# Patient Record
Sex: Female | Born: 1995 | Race: White | Hispanic: No | Marital: Married | Smoking: Never smoker
Health system: Southern US, Community
[De-identification: ages and names within clinical notes are randomized; demographics above are authoritative.]

## PROBLEM LIST (undated history)

## (undated) DIAGNOSIS — J45909 Unspecified asthma, uncomplicated: Secondary | ICD-10-CM

## (undated) DIAGNOSIS — F32A Depression, unspecified: Secondary | ICD-10-CM

## (undated) DIAGNOSIS — K589 Irritable bowel syndrome without diarrhea: Secondary | ICD-10-CM

## (undated) DIAGNOSIS — D649 Anemia, unspecified: Secondary | ICD-10-CM

## (undated) DIAGNOSIS — F329 Major depressive disorder, single episode, unspecified: Secondary | ICD-10-CM

## (undated) HISTORY — DX: Unspecified asthma, uncomplicated: J45.909

## (undated) HISTORY — DX: Depression, unspecified: F32.A

## (undated) HISTORY — DX: Major depressive disorder, single episode, unspecified: F32.9

## (undated) HISTORY — DX: Anemia, unspecified: D64.9

## (undated) HISTORY — DX: Irritable bowel syndrome, unspecified: K58.9

---
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
## 2015-01-26 ENCOUNTER — Other Ambulatory Visit: Payer: Self-pay | Admitting: Family Medicine

## 2015-02-10 ENCOUNTER — Ambulatory Visit (INDEPENDENT_AMBULATORY_CARE_PROVIDER_SITE_OTHER): Payer: Medicaid Other | Admitting: Family Medicine

## 2015-02-10 ENCOUNTER — Encounter: Payer: Self-pay | Admitting: Family Medicine

## 2015-02-10 VITALS — BP 91/61 | HR 67 | Temp 97.1°F | Ht 65.0 in | Wt 100.0 lb

## 2015-02-10 DIAGNOSIS — Z00129 Encounter for routine child health examination without abnormal findings: Secondary | ICD-10-CM

## 2015-02-10 DIAGNOSIS — J45909 Unspecified asthma, uncomplicated: Secondary | ICD-10-CM

## 2015-02-10 DIAGNOSIS — Z0000 Encounter for general adult medical examination without abnormal findings: Secondary | ICD-10-CM

## 2015-02-10 NOTE — Progress Notes (Signed)
Subjective:  Patient ID: Erin Shepard, female    DOB: 11/12/1995  Age: 19 y.o. MRN: [id number]  CC: Annual Exam   HPI Isys Thornsberry presents for annual physical. Needs paperwork completed for college. She does not have her shot records but says that her last injection was in seventh grade. She declines all immunizations are residual growth at this time. She states that her asthma is stable as long as she uses her Symbicort.  History Briceidy has a past medical history of Anemia; Asthma; and Depression.   She has no past surgical history on file.   Her family history includes Asthma in her mother.She reports that she has never smoked. She does not have any smokeless tobacco history on file. She reports that she does not drink alcohol or use illicit drugs.  Outpatient Prescriptions Prior to Visit  Medication Sig Dispense Refill  . albuterol (PROVENTIL HFA;VENTOLIN HFA) 108 (90 BASE) MCG/ACT inhaler Inhale 2 puffs into the lungs every 6 (six) hours as needed for wheezing or shortness of breath. 1 Inhaler 11  . budesonide-formoterol (SYMBICORT) 160-4.5 MCG/ACT inhaler Inhale 2 puffs into the lungs 2 (two) times daily. 1 Inhaler 11  . tretinoin microspheres (RETIN-A MICRO) 0.1 % gel Apply topically at bedtime. (Patient not taking: Reported on 11/08/2014) 45 g 5  . amoxicillin (AMOXIL) 500 MG tablet Take 1 tablet (500 mg total) by mouth 2 (two) times daily. 20 tablet 0  . SYMBICORT 160-4.5 MCG/ACT inhaler USE 2 PUFFS TWICE A DAY. NEEDS TO BE SEEN 10.2 Inhaler 1   No facility-administered medications prior to visit.    ROS Review of Systems  Constitutional: Negative for fever, chills, diaphoresis, appetite change, fatigue and unexpected weight change.  HENT: Negative for congestion, ear pain, hearing loss, postnasal drip, rhinorrhea, sneezing, sore throat and trouble swallowing.   Eyes: Negative for pain.  Respiratory: Negative for cough, chest tightness and shortness of breath.     Cardiovascular: Negative for chest pain and palpitations.  Gastrointestinal: Negative for nausea, vomiting, abdominal pain, diarrhea and constipation.  Genitourinary: Negative for dysuria, frequency and menstrual problem.  Musculoskeletal: Negative for joint swelling and arthralgias.  Skin: Negative for rash.  Neurological: Negative for dizziness, weakness, numbness and headaches.  Psychiatric/Behavioral: Negative for dysphoric mood and agitation.    Objective:  BP 91/61 mmHg  Pulse 67  Temp(Src) 97.1 F (36.2 C) (Oral)  Ht 5\' 5"  (1.651 m)  Wt 100 lb (45.36 kg)  BMI 16.64 kg/m2  BP Readings from Last 3 Encounters:  02/10/15 91/61  11/08/14 102/71  09/26/14 81/53    Wt Readings from Last 3 Encounters:  02/10/15 100 lb (45.36 kg) (4 %*, Z = -1.72)  11/08/14 96 lb 9.6 oz (43.817 kg) (2 %*, Z = -2.02)  09/26/14 98 lb 6.4 oz (44.634 kg) (3 %*, Z = -1.82)   * Growth percentiles are based on CDC 2-20 Years data.     Physical Exam  Constitutional: She is oriented to person, place, and time. She appears well-developed and well-nourished. No distress.  HENT:  Head: Normocephalic and atraumatic.  Right Ear: External ear normal.  Left Ear: External ear normal.  Nose: Nose normal.  Mouth/Throat: Oropharynx is clear and moist.  Eyes: Conjunctivae and EOM are normal. Pupils are equal, round, and reactive to light.  Neck: Normal range of motion. Neck supple. No thyromegaly present.  Cardiovascular: Normal rate, regular rhythm and normal heart sounds.   No murmur heard. Pulmonary/Chest: Effort normal and breath sounds normal. No  respiratory distress. She has no wheezes. She has no rales.  Abdominal: Soft. Bowel sounds are normal. She exhibits no distension. There is no tenderness.  Lymphadenopathy:    She has no cervical adenopathy.  Neurological: She is alert and oriented to person, place, and time. She has normal reflexes.  Skin: Skin is warm and dry.  Psychiatric: She has a  normal mood and affect. Her behavior is normal. Judgment and thought content normal.    No results found for: HGBA1C  Lab Results  Component Value Date   WBC 7.7 10/24/2013   HGB 14.0 10/24/2013   HCT 41.7 10/24/2013   GLUCOSE 159* 10/24/2013   ALT 18 10/24/2013   AST 28 10/24/2013   NA 138 10/24/2013   K 3.6 10/24/2013   CL 97 10/24/2013   CREATININE 0.75 10/24/2013   BUN 8 10/24/2013   CO2 20 10/24/2013    US Pelvis Complete  05/24/2013   CLINICAL DATA:  Pain during menstruation.  EXAM: TRANSABDOMINAL ULTRASOUND OF PELVIS  TECHNIQUE: Transabdominal ultrasound examination of the pelvis was performed including evaluation of the uterus, ovaries, adnexal regions, and pelvic cul-de-sac.  COMPARISON:  None.  FINDINGS: Uterus  Measurements: 6.5 x 2.5 x 4.5 cm No fibroids or other mass visualized.  Endometrium  Thickness: 6 mm  No focal abnormality visualized.  Right ovary  Not visualized due to overlying bowel gas.  Left ovary  Not visualized due to overlying bowel gas.  Other findings:  Trace free fluid  IMPRESSION: Normal-appearing uterus. Ovaries not visualized due to overlying bowel gas.   Electronically Signed   By: Jerene Dilling M.D.   On: 05/24/2013 17:18    Assessment & Plan:   Lema was seen today for annual exam.  Diagnoses and all orders for this visit:  Well adult exam  Asthma, chronic, unspecified asthma severity, uncomplicated Orders: -     albuterol (PROVENTIL HFA;VENTOLIN HFA) 108 (90 BASE) MCG/ACT inhaler; Inhale 2 puffs into the lungs every 6 (six) hours as needed for wheezing or shortness of breath. -     budesonide-formoterol (SYMBICORT) 160-4.5 MCG/ACT inhaler; Inhale 2 puffs into the lungs 2 (two) times daily.   I have discontinued Ms. Sortino's amoxicillin and SYMBICORT. I am also having her maintain her albuterol, budesonide-formoterol, and tretinoin microspheres.  No orders of the defined types were placed in this encounter.   Patient was  counseled on appropriate diet. Low carbohydrate, low sodium, high protein approach.   Regular exercise benefit with mix of cardio and resistance training was reviewed.  Reminded to wear seat belt when driving or a passenger.   Follow-up: Return in about 1 year (around 02/10/2016).  Mechele Claude, M.D.

## 2015-02-11 MED ORDER — ALBUTEROL SULFATE HFA 108 (90 BASE) MCG/ACT IN AERS: 2.0000 | INHALATION_SPRAY | Freq: Four times a day (QID) | RESPIRATORY_TRACT | Status: DC | PRN

## 2015-02-11 MED ORDER — BUDESONIDE-FORMOTEROL FUMARATE 160-4.5 MCG/ACT IN AERO: 2.0000 | INHALATION_SPRAY | Freq: Two times a day (BID) | RESPIRATORY_TRACT | Status: DC

## 2015-02-12 ENCOUNTER — Other Ambulatory Visit: Payer: Self-pay | Admitting: *Deleted

## 2015-02-12 DIAGNOSIS — J45909 Unspecified asthma, uncomplicated: Secondary | ICD-10-CM

## 2015-02-12 MED ORDER — BUDESONIDE-FORMOTEROL FUMARATE 160-4.5 MCG/ACT IN AERO: 2.0000 | INHALATION_SPRAY | Freq: Two times a day (BID) | RESPIRATORY_TRACT | Status: DC

## 2015-02-12 MED ORDER — ALBUTEROL SULFATE HFA 108 (90 BASE) MCG/ACT IN AERS: 2.0000 | INHALATION_SPRAY | Freq: Four times a day (QID) | RESPIRATORY_TRACT | Status: DC | PRN

## 2015-03-01 IMAGING — US US PELVIS COMPLETE
1 series · 14 of 23 positions shown · non-contrast
Comparison: None.

CLINICAL DATA: Pain during menstruation.

EXAM:
TRANSABDOMINAL ULTRASOUND OF PELVIS
TECHNIQUE: Transabdominal ultrasound examination of the pelvis was performed
including evaluation of the uterus, ovaries, adnexal regions, and
pelvic cul-de-sac.

[Series 1: us pelvis complete · 0.22mm/px · 14 of 23 slices shown]
[im 1/23]
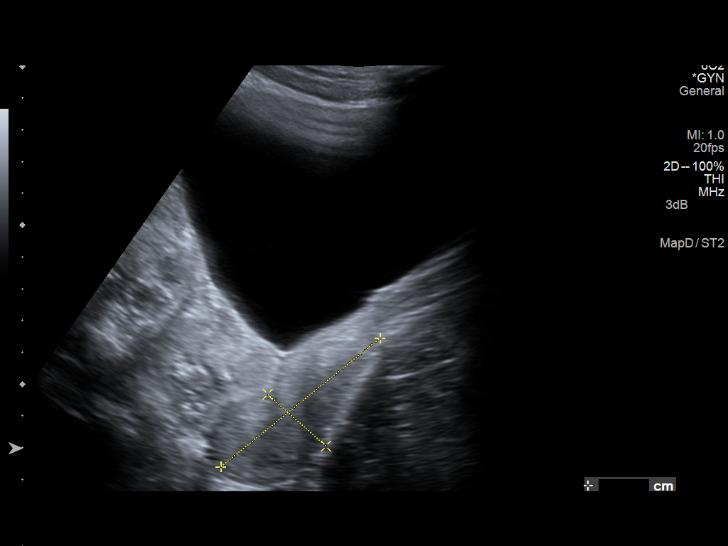
[im 3/23]
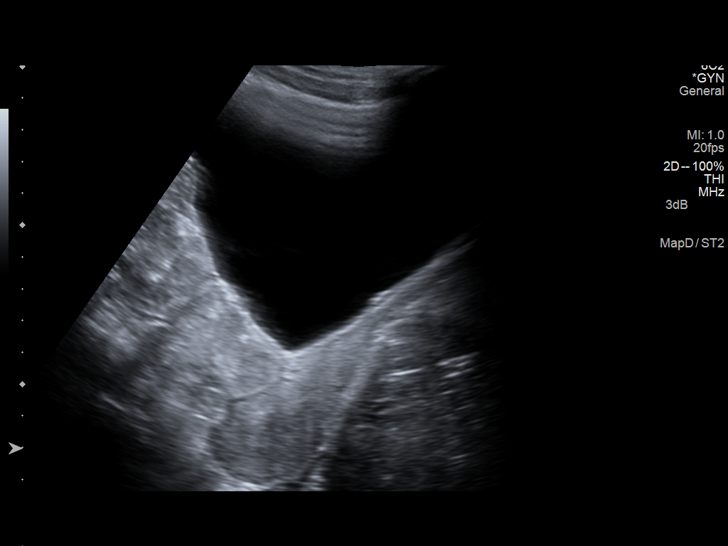
[im 5/23]
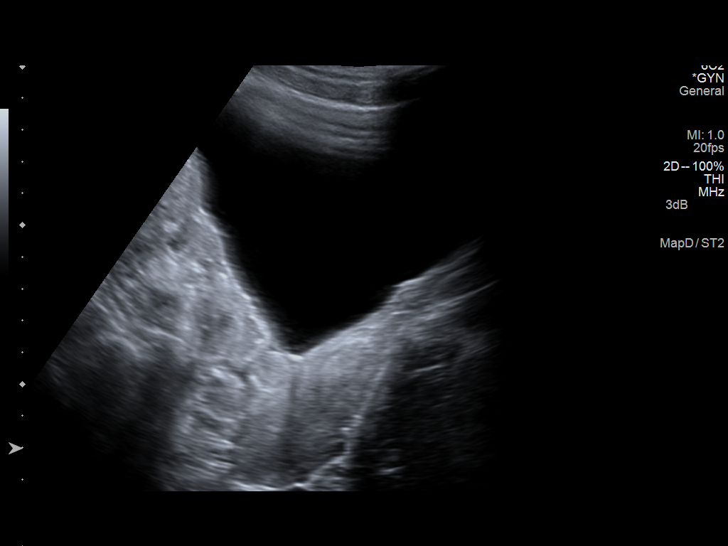
[im 6/23]
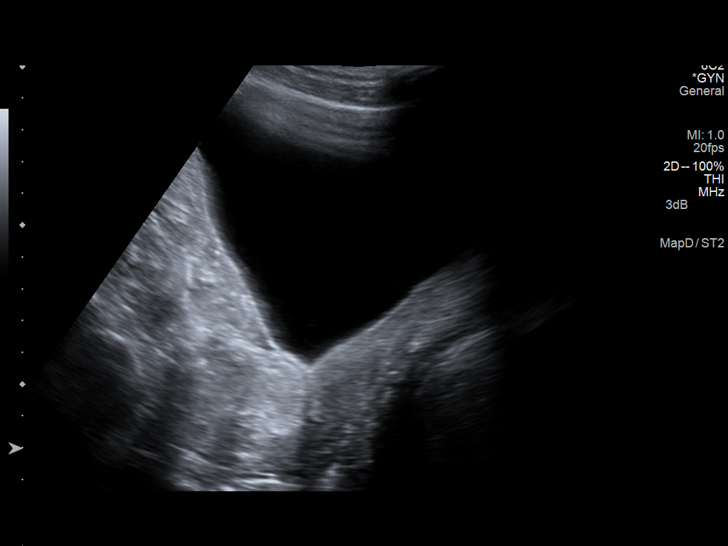
[im 8/23]
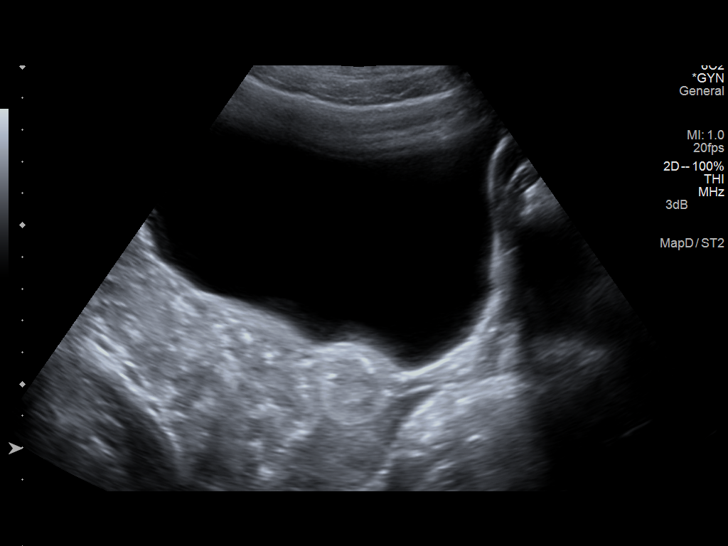
[im 10/23]
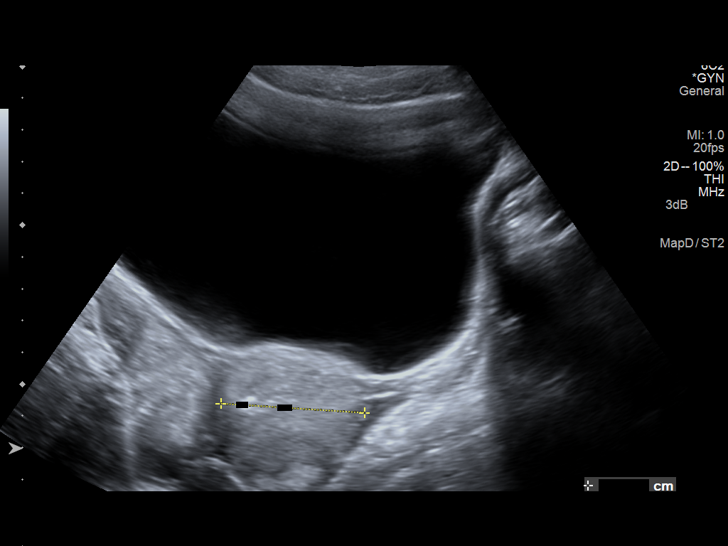
[im 11/23]
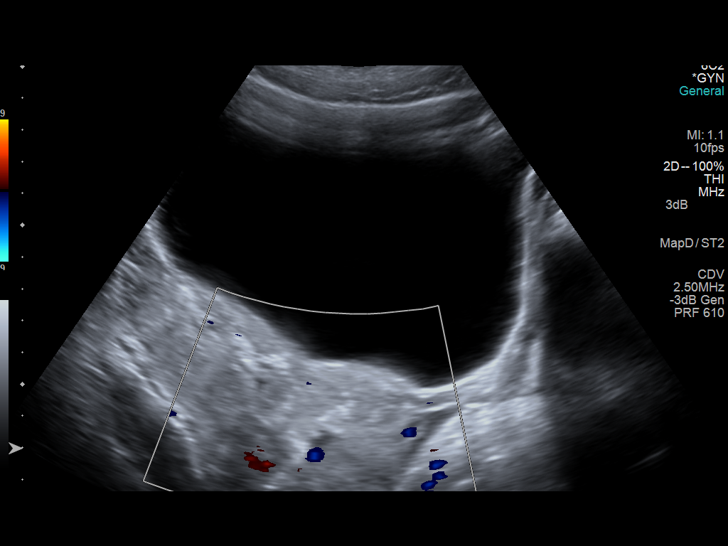
[im 13/23]
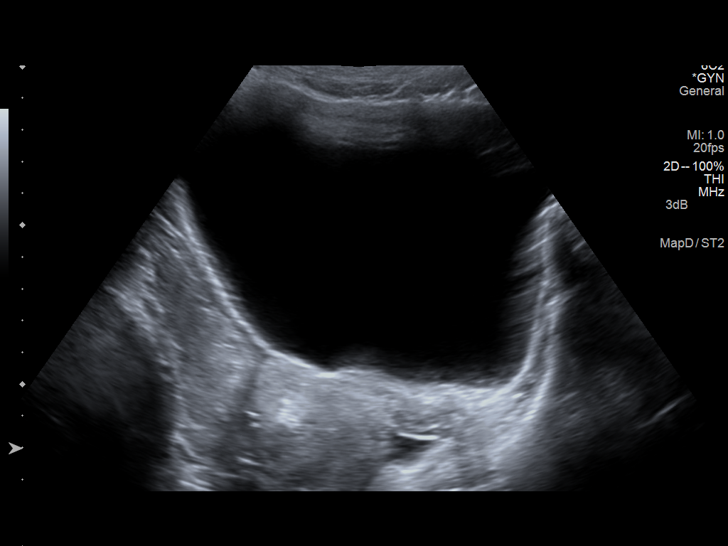
[im 14/23]
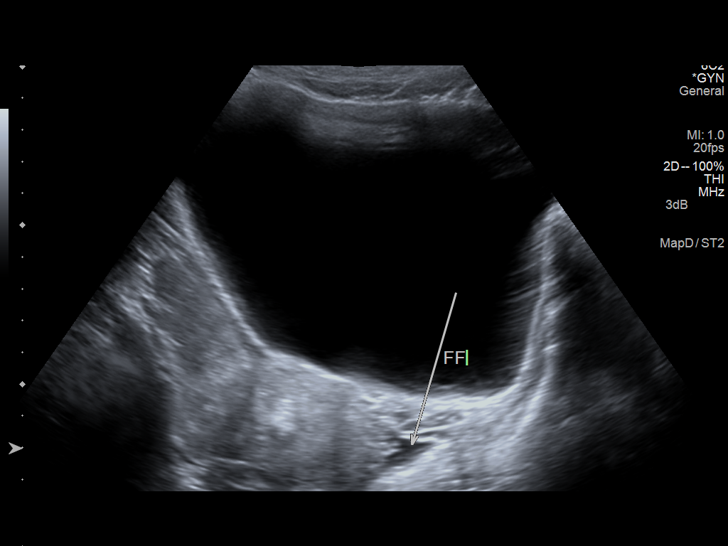
[im 16/23]
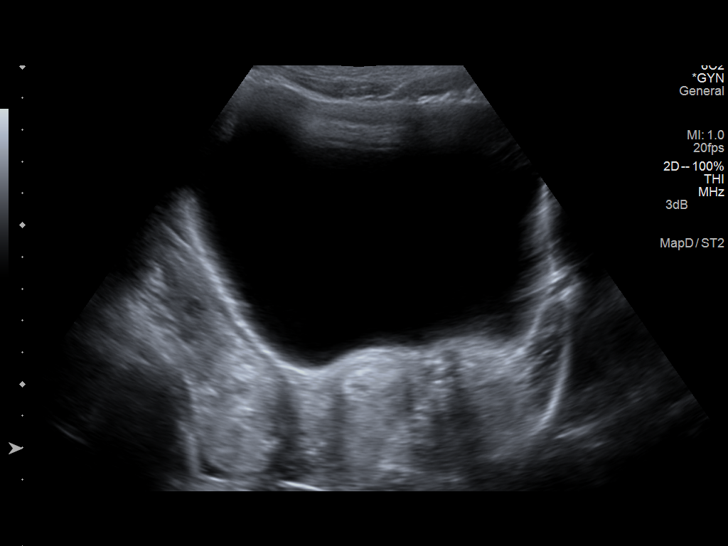
[im 18/23]
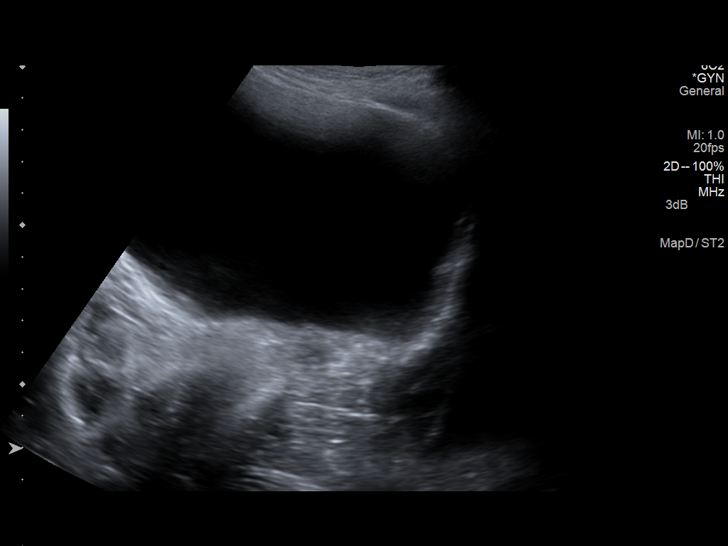
[im 19/23]
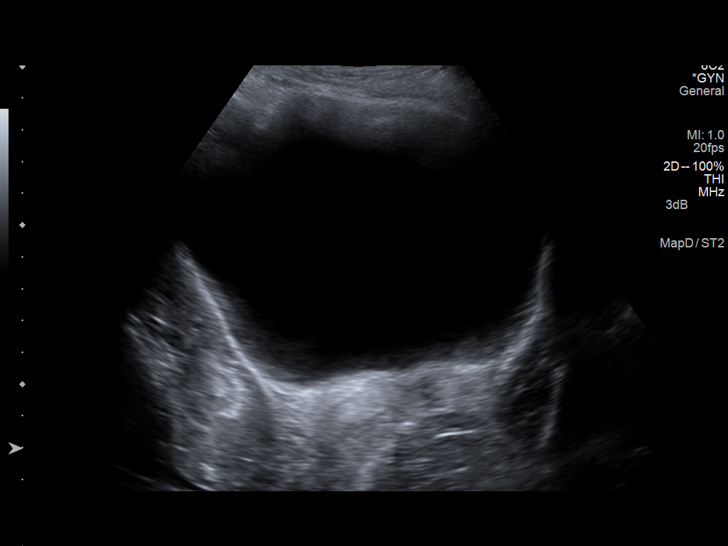
[im 21/23]
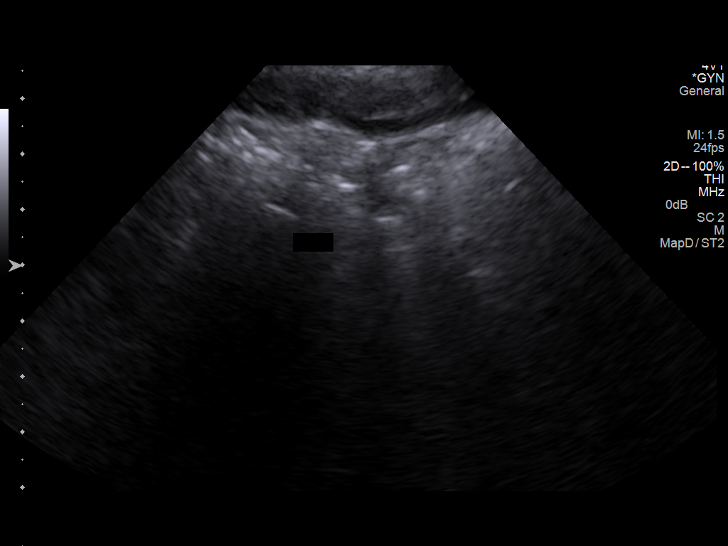
[im 23/23]
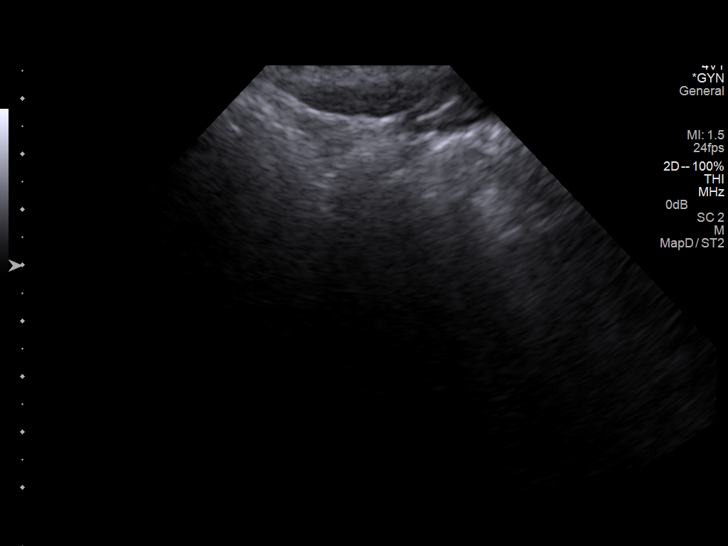

[14 of 23 positions shown; findings below may reference images not displayed]

FINDINGS: Uterus

Measurements: 6.5 x 2.5 x 4.5 cm No fibroids or other mass
visualized.

Endometrium

Thickness: 6 mm  No focal abnormality visualized.

Right ovary

Not visualized due to overlying bowel gas.

Left ovary

Not visualized due to overlying bowel gas.

Other findings:  Trace free fluid
IMPRESSION: Normal-appearing uterus. Ovaries not visualized due to overlying
bowel gas.

## 2016-02-13 ENCOUNTER — Other Ambulatory Visit: Payer: Self-pay | Admitting: Family Medicine

## 2016-02-18 NOTE — Telephone Encounter (Signed)
Already done

## 2016-02-20 ENCOUNTER — Telehealth: Payer: Self-pay | Admitting: Family Medicine

## 2016-02-20 NOTE — Telephone Encounter (Signed)
Scheduled appt. Pt notified.

## 2016-03-01 ENCOUNTER — Ambulatory Visit: Payer: Medicaid Other | Admitting: Family Medicine

## 2016-03-04 ENCOUNTER — Ambulatory Visit: Payer: Medicaid Other | Admitting: Family Medicine

## 2016-03-15 ENCOUNTER — Ambulatory Visit (INDEPENDENT_AMBULATORY_CARE_PROVIDER_SITE_OTHER): Payer: Medicaid Other | Admitting: Family Medicine

## 2016-03-15 ENCOUNTER — Encounter: Payer: Self-pay | Admitting: Family Medicine

## 2016-03-15 VITALS — BP 93/60 | HR 81 | Temp 98.5°F | Ht 65.04 in | Wt 101.8 lb

## 2016-03-15 DIAGNOSIS — M6281 Muscle weakness (generalized): Secondary | ICD-10-CM | POA: Diagnosis not present

## 2016-03-15 DIAGNOSIS — R5382 Chronic fatigue, unspecified: Secondary | ICD-10-CM | POA: Diagnosis not present

## 2016-03-15 NOTE — Progress Notes (Signed)
   HPI  Patient presents today with chronic fatigue and muscle weakness.  Patient's lines her last 3 years she's had persistent fatigue and weakness. She states her mother has very serious autoimmune diseas that she does not remember the name of.  Patient states that she is a Engineer, maintenance in psychology. She is working part-time this summer. After working a full week she states that she's so weak that she's not able to stand up for very long period time.  She denies any vision changes, headaches, shortness of breath, cough, rash, or focal weakness or numbness or lung.  She states that her symptoms seem to have gotten worse and she had a very elevated temperature in March 2015. It was high as 104.5 in our clinic.   PMH: Smoking status noted ROS: Per HPI  Objective: BP 93/60   Pulse 81   Temp 98.5 F (36.9 C) (Oral)   Ht 5' 5.04" (1.652 m)   Wt 101 lb 12.8 oz (46.2 kg)   BMI 16.92 kg/m  Gen: NAD, alert, cooperative with exam HEENT: NCAT, EOMI, PERRLA CV: RRR, good S1/S2, no murmur Resp: CTABL, no wheezes, non-labored Abd: SNTND, BS present, no guarding or organomegaly Ext: No edema, warm Neuro: Alert and oriented, drink 5/5 and sensation intact in all 4 extremities, cranial nerves 2 through 12 intact  Assessment and plan:  # Chronic fatigue, muscle aches No clear symptoms of autoimmune disease, however I have cast a net today given her mother's history and her chronic symptoms. ANA, CK, ESR Also checking for anemia with CBC She does not have recurrent fever to cause concern for leukemia. Neurologic exam is very reassuring causing less concern about MS or other central lesion. Discussed our open-door for follow-up, at this time her symptoms are very nonspecific. If the labs are negative I would continue to follow clinically.   Orders Placed This Encounter  Procedures  . CK  . Sedimentation rate  . ANA Comprehensive Panel  . CBC With Differential  .  CMP14+EGFR  . TSH     Laroy Apple, MD [facility] 03/15/2016, 11:52 AM

## 2016-03-15 NOTE — Patient Instructions (Signed)
Great to meet you!  Please come back if you have any additional symptoms.   We will call with results within 1 week, some of your labs are specialized and may take a few extra days to come back.

## 2016-03-16 LAB — CBC WITH DIFFERENTIAL
BASOS ABS: 0 10*3/uL (ref 0.0–0.2)
Basos: 0 %
EOS (ABSOLUTE): 0.1 10*3/uL (ref 0.0–0.4)
Eos: 2 %
HEMOGLOBIN: 13.1 g/dL (ref 11.1–15.9)
Hematocrit: 39.7 % (ref 34.0–46.6)
IMMATURE GRANS (ABS): 0 10*3/uL (ref 0.0–0.1)
IMMATURE GRANULOCYTES: 0 %
LYMPHS: 23 %
Lymphocytes Absolute: 1.3 10*3/uL (ref 0.7–3.1)
MCH: 29.7 pg (ref 26.6–33.0)
MCHC: 33 g/dL (ref 31.5–35.7)
MCV: 90 fL (ref 79–97)
MONOCYTES: 11 %
Monocytes Absolute: 0.6 10*3/uL (ref 0.1–0.9)
Neutrophils Absolute: 3.6 10*3/uL (ref 1.4–7.0)
Neutrophils: 64 %
RBC: 4.41 x10E6/uL (ref 3.77–5.28)
RDW: 13 % (ref 12.3–15.4)
WBC: 5.7 10*3/uL (ref 3.4–10.8)

## 2016-03-16 LAB — ANA COMPREHENSIVE PANEL
Chromatin Ab SerPl-aCnc: <0.2 AI (ref 0.0–0.9)
ENA SM Ab Ser-aCnc: <0.2 AI (ref 0.0–0.9)
ENA SSA (RO) Ab: <0.2 AI (ref 0.0–0.9)
ENA SSB (LA) Ab: <0.2 AI (ref 0.0–0.9)
Scleroderma SCL-70: <0.2 AI (ref 0.0–0.9)

## 2016-03-16 LAB — CMP14+EGFR
ALBUMIN: 4.6 g/dL (ref 3.5–5.5)
ALT: 26 IU/L (ref 0–32)
AST: 22 IU/L (ref 0–40)
Albumin/Globulin Ratio: 2 (ref 1.2–2.2)
Alkaline Phosphatase: 91 IU/L (ref 39–117)
BUN/Creatinine Ratio: 22 (ref 9–23)
BUN: 12 mg/dL (ref 6–20)
Bilirubin Total: 0.6 mg/dL (ref 0.0–1.2)
CALCIUM: 9.1 mg/dL (ref 8.7–10.2)
CHLORIDE: 97 mmol/L (ref 96–106)
CO2: 25 mmol/L (ref 18–29)
Creatinine, Ser: 0.54 mg/dL — ABNORMAL LOW (ref 0.57–1.00)
GFR calc Af Amer: 158 mL/min/{1.73_m2} (ref >59)
GFR, EST NON AFRICAN AMERICAN: 137 mL/min/{1.73_m2} (ref >59)
GLUCOSE: 69 mg/dL (ref 65–99)
Globulin, Total: 2.3 g/dL (ref 1.5–4.5)
POTASSIUM: 4.1 mmol/L (ref 3.5–5.2)
SODIUM: 138 mmol/L (ref 134–144)
TOTAL PROTEIN: 6.9 g/dL (ref 6.0–8.5)

## 2016-03-16 LAB — TSH: TSH: 1.07 u[IU]/mL (ref 0.450–4.500)

## 2016-03-16 LAB — CK: CK TOTAL: 100 U/L (ref 24–173)

## 2016-03-16 LAB — SEDIMENTATION RATE: Sed Rate: 2 mm/hr (ref 0–32)

## 2016-04-23 ENCOUNTER — Ambulatory Visit (INDEPENDENT_AMBULATORY_CARE_PROVIDER_SITE_OTHER): Payer: Medicaid Other | Admitting: Family Medicine

## 2016-04-23 ENCOUNTER — Encounter: Payer: Self-pay | Admitting: Family Medicine

## 2016-04-23 ENCOUNTER — Telehealth: Payer: Self-pay | Admitting: Family Medicine

## 2016-04-23 VITALS — BP 96/59 | HR 92 | Temp 98.7°F | Ht 65.04 in | Wt 99.0 lb

## 2016-04-23 DIAGNOSIS — J0100 Acute maxillary sinusitis, unspecified: Secondary | ICD-10-CM

## 2016-04-23 DIAGNOSIS — L03211 Cellulitis of face: Secondary | ICD-10-CM

## 2016-04-23 MED ORDER — MUPIROCIN 2 % EX OINT: 1.0000 "application " | TOPICAL_OINTMENT | Freq: Two times a day (BID) | CUTANEOUS | 0 refills | Status: AC

## 2016-04-23 MED ORDER — AMOXICILLIN-POT CLAVULANATE 875-125 MG PO TABS
1.0000 | ORAL_TABLET | Freq: Two times a day (BID) | ORAL | 0 refills | Status: DC
Start: 2016-04-23 — End: 2016-05-11

## 2016-04-23 NOTE — Patient Instructions (Signed)
Great to see you!   Sinusitis, Adult Sinusitis is redness, soreness, and puffiness (inflammation) of the air pockets in the bones of your face (sinuses). The redness, soreness, and puffiness can cause air and mucus to get trapped in your sinuses. This can allow germs to grow and cause an infection.  HOME CARE   Drink enough fluids to keep your pee (urine) clear or pale yellow.  Use a humidifier in your home.  Run a hot shower to create steam in the bathroom. Sit in the bathroom with the door closed. Breathe in the steam 3-4 times a day.  Put a warm, moist washcloth on your face 3-4 times a day, or as told by your doctor.  Use salt water sprays (saline sprays) to wet the thick fluid in your nose. This can help the sinuses drain.  Only take medicine as told by your doctor. GET HELP RIGHT AWAY IF:   Your pain gets worse.  You have very bad headaches.  You are sick to your stomach (nauseous).  You throw up (vomit).  You are very sleepy (drowsy) all the time.  Your face is puffy (swollen).  Your vision changes.  You have a stiff neck.  You have trouble breathing. MAKE SURE YOU:   Understand these instructions.  Will watch your condition.  Will get help right away if you are not doing well or get worse.   This information is not intended to replace advice given to you by your health care provider. Make sure you discuss any questions you have with your health care provider.   Document Released: 01/26/2008 Document Revised: 08/30/2014 Document Reviewed: 03/14/2012 Elsevier Interactive Patient Education [employer]2016 [employer] Inc.

## 2016-04-23 NOTE — Progress Notes (Signed)
   HPI  Patient presents today here with draining of her nasal ring as well as chills and headache.  Nasal ring draining. Patient has had the nasal ring for 6+ months, he has had 3-4 episodes of draining purulent white drainage that last 1-2 weeks, she places salts recommended by her piercer that help dried up, she also uses tea tree oil. 2 weeks she's had this drainage which has not been improving.  Over the last 3 days she's had headache, subjective fever, measured at 99.8 this morning, chills, and frontal headache.  She denies any dyspnea, cough, or difficulty tolerating food or fluids.  She has tolerated amoxicillin easily in the past. She takes probiotics daily.   PMH: Smoking status noted ROS: Per HPI  Objective: BP (!) 96/59 (BP Location: Right Arm, Patient Position: Sitting, Cuff Size: Normal)   Pulse 92   Temp 98.7 F (37.1 C) (Oral)   Ht 5' 5.04" (1.652 m)   Wt 99 lb (44.9 kg)   BMI 16.45 kg/m  Gen: NAD, alert, cooperative with exam HEENT: NCAT, left Nare without any signs of inflammation, on the external nose at the base of the nose ring there is a small erythematous papule that is nontender to palpation, no fluctuance or induration. The size is approximately 2-3 mm in diameter. He has bilateral tenderness to palpation of the frontal and left-sided maxillary sinus. CV: RRR, good S1/S2, no murmur Resp: CTABL, no wheezes, non-labored Ext: No edema, warm  Neuro: Alert and oriented, No gross deficits  Assessment and plan:  # Cellulitis of the face, infected nose ring With mupirocin ointment Discussed MRSA eradication with twice a day application for 3 days.  # Acute maxillary sinusitis Bilateral frontal and left-sided maxillary sinus tenderness, maxillary sinus is the most tender today. Cover with Augmentin Return to clinic if no improvement is expected or worsening.    Meds ordered this encounter  Medications  . mupirocin ointment (BACTROBAN) 2 %    Sig:  Place 1 application into the nose 2 (two) times daily. For 3 days    Dispense:  22 g    Refill:  0  . amoxicillin-clavulanate (AUGMENTIN) 875-125 MG tablet    Sig: Take 1 tablet by mouth 2 (two) times daily.    Dispense:  20 tablet    Refill:  0    Murtis SinkSam Layliana Devins, MD [facility] 04/23/2016, 8:56 AM

## 2016-04-23 NOTE — Telephone Encounter (Signed)
Spoke to pt and advised it will take a little bit for the antibiotics to kick in and to take tylenol every 6 hours for the fever and if fever greater than 101 with tylenol after 24 hours of taking antibiotics to call back. Pt voiced understanding.

## 2016-04-24 ENCOUNTER — Other Ambulatory Visit: Payer: Self-pay | Admitting: Family Medicine

## 2016-04-24 DIAGNOSIS — J45909 Unspecified asthma, uncomplicated: Secondary | ICD-10-CM

## 2016-05-11 ENCOUNTER — Encounter: Payer: Self-pay | Admitting: Family Medicine

## 2016-05-11 ENCOUNTER — Telehealth: Payer: Self-pay | Admitting: Family Medicine

## 2016-05-11 ENCOUNTER — Ambulatory Visit (INDEPENDENT_AMBULATORY_CARE_PROVIDER_SITE_OTHER): Payer: Medicaid Other | Admitting: Family Medicine

## 2016-05-11 VITALS — BP 93/62 | HR 85 | Temp 97.9°F | Ht 65.04 in | Wt 101.2 lb

## 2016-05-11 DIAGNOSIS — R109 Unspecified abdominal pain: Secondary | ICD-10-CM | POA: Diagnosis not present

## 2016-05-11 LAB — URINALYSIS
BILIRUBIN UA: NEGATIVE
Glucose, UA: NEGATIVE
KETONES UA: NEGATIVE
Leukocytes, UA: NEGATIVE
NITRITE UA: NEGATIVE
Protein, UA: NEGATIVE
RBC, UA: NEGATIVE
Specific Gravity, UA: 1.015 (ref 1.005–1.030)
UUROB: 0.2 mg/dL (ref 0.2–1.0)
pH, UA: 8.5 — ABNORMAL HIGH (ref 5.0–7.5)

## 2016-05-11 LAB — PREGNANCY, URINE: PREG TEST UR: NEGATIVE

## 2016-05-11 NOTE — Telephone Encounter (Signed)
Spoke to pt regarding abd pain Pt has appt scheduled Instructed pt to try SUPERVALU INCBRAT diet

## 2016-05-11 NOTE — Progress Notes (Signed)
   HPI  Patient presents today here with abdominal pain.  Patient expired she's had abdominal pain generalized for about one week, she's also had associated nausea with one episode of nonbloody/nonbilious emesis.  She describes generalized midabdominal pain that's worse before stooling and right after stooling. Hurts with standing for long periods of time and gets better after sitting for about half an hour.  She uses basal temperature method for contraception, she has been sexually active in the last month, no vaginal discharge, no dysuria.  No fever, chills, sweats. She's tolerating food and fluids normally  PMH: Smoking status noted ROS: Per HPI  Objective: BP 93/62   Pulse 85   Temp 97.9 F (36.6 C) (Oral)   Ht 5' 5.04" (1.652 m)   Wt 101 lb 3.2 oz (45.9 kg)   BMI 16.82 kg/m  Gen: NAD, alert, cooperative with exam HEENT: NCAT,  CV: RRR, good S1/S2, no murmur Resp: CTABL, no wheezes, non-labored Abd: Soft, mild tenderness to palpation in bilateral lower quadrants and suprapubic area, as well as left upper quadrant, no periumbilical pain, no guarding, positive bowel sounds Ext: No edema, warm Neuro: Alert and oriented, No gross deficits  Assessment and plan:  # Abdominal pain Unclear etiology specifically, however ovarian cyst(possibly physiologic) versus constipation versus early IBS are all good possibilities. Urinalysis not indicative of UTI, urine pregnancy test is negative. Reassurance provided, return to clinic if symptoms worsen or are not improving over the next week or so. Offered labs, however she would like to defer this to see if her symptoms don't go away without further intervention.    Murtis SinkSam Zsofia Prout, MD [facility] 05/11/2016, 5:18 PM

## 2016-05-12 ENCOUNTER — Telehealth: Payer: Self-pay | Admitting: Family Medicine

## 2016-05-13 NOTE — Telephone Encounter (Signed)
Patient called with concerns on nasal piercing.  Patient states that there was a pimple right at piercing and she accidentally popped it.  Informed patient to clean piercing 2 times daily and if it does not get any better to contact our office.

## 2016-05-21 ENCOUNTER — Ambulatory Visit (INDEPENDENT_AMBULATORY_CARE_PROVIDER_SITE_OTHER): Payer: Medicaid Other | Admitting: Family

## 2016-05-21 ENCOUNTER — Encounter: Payer: Self-pay | Admitting: Family

## 2016-05-21 VITALS — BP 88/60 | HR 65 | Temp 97.6°F | Ht 65.04 in | Wt 99.6 lb

## 2016-05-21 DIAGNOSIS — S29019A Strain of muscle and tendon of unspecified wall of thorax, initial encounter: Secondary | ICD-10-CM

## 2016-05-21 DIAGNOSIS — S29012A Strain of muscle and tendon of back wall of thorax, initial encounter: Secondary | ICD-10-CM

## 2016-05-21 MED ORDER — CYCLOBENZAPRINE HCL 5 MG PO TABS: 5.0000 mg | ORAL_TABLET | Freq: Three times a day (TID) | ORAL | 1 refills | Status: DC | PRN

## 2016-05-21 MED ORDER — NAPROXEN 500 MG PO TABS: 500.0000 mg | ORAL_TABLET | Freq: Two times a day (BID) | ORAL | 1 refills | Status: AC
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
## 2016-05-21 NOTE — Patient Instructions (Signed)
Thoracic Strain A thoracic strain, which is sometimes called a mid-back strain, is an injury to the muscles or tendons that attach to the upper part of your back behind your chest. This type of injury occurs when a muscle is overstretched or overloaded.  Thoracic strains can range from mild to severe. Mild strains may involve stretching a muscle or tendon without tearing it. These injuries may heal in 1-2 weeks. More severe strains involve tearing of muscle fibers or tendons. These will cause more pain and may take 6-8 weeks to heal. CAUSES This condition may be caused by:  An injury in which a sudden force is placed on the muscle.  Exercising without properly warming up.  Overuse of the muscle.  Improper form during certain movements.  Other injuries that surround or cause stress on the mid-back, causing a strain on the muscles. In some cases, the cause may not be known. RISK FACTORS This injury is more common in:  Athletes.  People with obesity. SYMPTOMS The main symptom of this condition is pain, especially with movement. Other symptoms include:  Bruising.  Swelling.  Spasm. DIAGNOSIS This condition may be diagnosed with a physical exam. X-rays may be taken to check for a fracture. TREATMENT This condition may be treated with:  Resting and icing the injured area.  Physical therapy. This will involve doing stretching and strengthening exercises.  Medicines for pain and inflammation. HOME CARE INSTRUCTIONS  Rest as needed. Follow instructions from your health care provider about any restrictions on activity.  If directed, apply ice to the injured area:  Put ice in a plastic bag.  Place a towel between your skin and the bag.  Leave the ice on for 20 minutes, 2-3 times per day.  Take over-the-counter and prescription medicines only as told by your health care provider.  Begin doing exercises as told by your health care provider or physical therapist.  Always  warm up properly before physical activity or sports.  Bend your knees before you lift heavy objects.  Keep all follow-up visits as told by your health care provider. This is important. SEEK MEDICAL CARE IF:  Your pain is not helped by medicine.  Your pain, bruising, or swelling is getting worse.  You have a fever. SEEK IMMEDIATE MEDICAL CARE IF:  You have shortness of breath.  You have chest pain.  You develop numbness or weakness in your legs.  You have involuntary loss of urine (urinary incontinence).   This information is not intended to replace advice given to you by your health care provider. Make sure you discuss any questions you have with your health care provider.   Document Released: 10/30/2003 Document Revised: 04/30/2015 Document Reviewed: 10/03/2014 Elsevier Interactive Patient Education 2016 Else[employer] Inc[employer] Back Exercises The following exercises strengthen the muscles that help to support the back. They also help to keep the lower back flexible. Doing these exercises can help to prevent back pain or lessen existing pain. If you have back pain or discomfort, try doing these exercises 2-3 times each day or as told by your health care provider. When the pain goes away, do them once each day, but increase the number of times that you repeat the steps for each exercise (do more repetitions). If you do not have back pain or discomfort, do these exercises once each day or as told by your health care provider. EXERCISES Single Knee to Chest Repeat these steps 3-5 times for each leg: 1. Lie on your back on a  firm bed or the floor with your legs extended. 2. Bring one knee to your chest. Your other leg should stay extended and in contact with the floor. 3. Hold your knee in place by grabbing your knee or thigh. 4. Pull on your knee until you feel a gentle stretch in your lower back. 5. Hold the stretch for 10-30 seconds. 6. Slowly release and straighten your leg. Pelvic  Tilt Repeat these steps 5-10 times: 1. Lie on your back on a firm bed or the floor with your legs extended. 2. Bend your knees so they are pointing toward the ceiling and your feet are flat on the floor. 3. Tighten your lower abdominal muscles to press your lower back against the floor. This motion will tilt your pelvis so your tailbone points up toward the ceiling instead of pointing to your feet or the floor. 4. With gentle tension and even breathing, hold this position for 5-10 seconds. Cat-Cow Repeat these steps until your lower back becomes more flexible: 1. Get into a hands-and-knees position on a firm surface. Keep your hands under your shoulders, and keep your knees under your hips. You may place padding under your knees for comfort. 2. Let your head hang down, and point your tailbone toward the floor so your lower back becomes rounded like the back of a cat. 3. Hold this position for 5 seconds. 4. Slowly lift your head and point your tailbone up toward the ceiling so your back forms a sagging arch like the back of a cow. 5. Hold this position for 5 seconds. Press-Ups Repeat these steps 5-10 times: 1. Lie on your abdomen (face-down) on the floor. 2. Place your palms near your head, about shoulder-width apart. 3. While you keep your back as relaxed as possible and keep your hips on the floor, slowly straighten your arms to raise the top half of your body and lift your shoulders. Do not use your back muscles to raise your upper torso. You may adjust the placement of your hands to make yourself more comfortable. 4. Hold this position for 5 seconds while you keep your back relaxed. 5. Slowly return to lying flat on the floor. Bridges Repeat these steps 10 times: 1. Lie on your back on a firm surface. 2. Bend your knees so they are pointing toward the ceiling and your feet are flat on the floor. 3. Tighten your buttocks muscles and lift your buttocks off of the floor until your waist is at  almost the same height as your knees. You should feel the muscles working in your buttocks and the back of your thighs. If you do not feel these muscles, slide your feet 1-2 inches farther away from your buttocks. 4. Hold this position for 3-5 seconds. 5. Slowly lower your hips to the starting position, and allow your buttocks muscles to relax completely. If this exercise is too easy, try doing it with your arms crossed over your chest. Abdominal Crunches Repeat these steps 5-10 times: 1. Lie on your back on a firm bed or the floor with your legs extended. 2. Bend your knees so they are pointing toward the ceiling and your feet are flat on the floor. 3. Cross your arms over your chest. 4. Tip your chin slightly toward your chest without bending your neck. 5. Tighten your abdominal muscles and slowly raise your trunk (torso) high enough to lift your shoulder blades a tiny bit off of the floor. Avoid raising your torso higher than that, because  it can put too much stress on your low back and it does not help to strengthen your abdominal muscles. 6. Slowly return to your starting position. Back Lifts Repeat these steps 5-10 times: 1. Lie on your abdomen (face-down) with your arms at your sides, and rest your forehead on the floor. 2. Tighten the muscles in your legs and your buttocks. 3. Slowly lift your chest off of the floor while you keep your hips pressed to the floor. Keep the back of your head in line with the curve in your back. Your eyes should be looking at the floor. 4. Hold this position for 3-5 seconds. 5. Slowly return to your starting position. SEEK MEDICAL CARE IF:  Your back pain or discomfort gets much worse when you do an exercise.  Your back pain or discomfort does not lessen within 2 hours after you exercise. If you have any of these problems, stop doing these exercises right away. Do not do them again unless your health care provider says that you can. SEEK IMMEDIATE  MEDICAL CARE IF:  You develop sudden, severe back pain. If this happens, stop doing the exercises right away. Do not do them again unless your health care provider says that you can.   This information is not intended to replace advice given to you by your health care provider. Make sure you discuss any questions you have with your health care provider.   Document Released: 09/16/2004 Document Revised: 04/30/2015 Document Reviewed: 10/03/2014 Elsevier Interactive Patient Education [employer]

## 2016-05-25 ENCOUNTER — Ambulatory Visit (INDEPENDENT_AMBULATORY_CARE_PROVIDER_SITE_OTHER): Payer: Medicaid Other | Admitting: Family Medicine

## 2016-05-25 ENCOUNTER — Ambulatory Visit (INDEPENDENT_AMBULATORY_CARE_PROVIDER_SITE_OTHER): Payer: Medicaid Other

## 2016-05-25 ENCOUNTER — Encounter: Payer: Self-pay | Admitting: Family Medicine

## 2016-05-25 VITALS — BP 94/64 | HR 67 | Temp 97.2°F | Ht 65.04 in | Wt 97.8 lb

## 2016-05-25 DIAGNOSIS — R1011 Right upper quadrant pain: Secondary | ICD-10-CM | POA: Diagnosis not present

## 2016-05-25 LAB — URINALYSIS, COMPLETE
BILIRUBIN UA: NEGATIVE
GLUCOSE, UA: NEGATIVE
KETONES UA: NEGATIVE
Leukocytes, UA: NEGATIVE
Nitrite, UA: NEGATIVE
PH UA: 8.5 — AB (ref 5.0–7.5)
PROTEIN UA: NEGATIVE
RBC UA: NEGATIVE
SPEC GRAV UA: 1.015 (ref 1.005–1.030)
UUROB: 0.2 mg/dL (ref 0.2–1.0)

## 2016-05-25 LAB — MICROSCOPIC EXAMINATION

## 2016-05-25 LAB — PREGNANCY, URINE: PREG TEST UR: NEGATIVE

## 2016-05-25 NOTE — Patient Instructions (Signed)
Great to see you!  We will work on your studies and get your ultrasound scheduled.   Please don't hesitate to call if you have concerns

## 2016-05-25 NOTE — Progress Notes (Signed)
   HPI  Patient presents today here with continued abdominal pain  Patient explains that she's had 3-4 weeks of intermittent abdominal pain described as sharp bilateral upper abdominal pain worse after eating, sometimes also persistent after she has a bowel movement.  She denies any constipation overtly, she has 1-2 bowel movements daily. She does hesitate when asked about constipation and give somewhat of a changing story.  No fever, chills, sweats. Tolerating food and fluids normally. She uses basal temperature method for birth control, she is a proximally 2 weeks post her last period.  She has had sharp colicky type pain after eating that lasts 30 minutes to an hour intermittently  PMH: Smoking status noted ROS: Per HPI  Objective: BP 94/64   Pulse 67   Temp 97.2 F (36.2 C) (Oral)   Ht 5' 5.04" (1.652 m)   Wt 97 lb 12.8 oz (44.4 kg)   BMI 16.25 kg/m  Gen: NAD, alert, cooperative with exam HEENT: NCAT CV: RRR, good S1/S2, no murmur Resp: CTABL, no wheezes, non-labored Abd: Soft, right upper quadrant tenderness to palpation, epigastric tenderness to palpation in left lower quadrant tenderness to palpation, positive bowel sounds, intermittent voluntary guarding Ext: No edema, warm Neuro: Alert and oriented, No gross deficits  Plain film of the abd- moderate to severe stool burden.   Assessment and plan:  # Right upper quadrant abdominal pain Most significant tenderness to palpation on my exam was right upper quadrant, she does complain of generalized abdominal pain Labs, plain film, abdominal ultrasound Urinalysis and urine pregnancy test before plain film   Orders Placed This Encounter  Procedures  . DG Abd 2 Views    Standing Status:   Future    Standing Expiration Date:   07/25/2017    Order Specific Question:   Reason for Exam (SYMPTOM  OR DIAGNOSIS REQUIRED)    Answer:   RUQ and generalized abd paoin, + murphys sign    Order Specific Question:   Is patient  pregnant?    Answer:   No    Order Specific Question:   Preferred imaging location?    Answer:   External  . US Abdomen Complete    Standing Status:   Future    Standing Expiration Date:   07/25/2017    Order Specific Question:   Reason for Exam (SYMPTOM  OR DIAGNOSIS REQUIRED)    Answer:   RUQ abd pain, + murphy sign    Order Specific Question:   Preferred imaging location?    Answer:   External  . CMP14+EGFR  . CBC with Differential  . Lipase  . Pregnancy, urine  . Urinalysis, Complete    No orders of the defined types were placed in this encounter.   Laroy Apple, MD [facility] 05/25/2016, 9:17 AM

## 2016-05-26 LAB — CBC WITH DIFFERENTIAL/PLATELET

## 2016-05-26 LAB — CMP14+EGFR
A/G RATIO: 2.1 (ref 1.2–2.2)
ALBUMIN: 4.9 g/dL (ref 3.5–5.5)
ALK PHOS: 79 IU/L (ref 39–117)
ALT: 22 IU/L (ref 0–32)
AST: 26 IU/L (ref 0–40)
BUN / CREAT RATIO: 16 (ref 9–23)
BUN: 10 mg/dL (ref 6–20)
Bilirubin Total: 0.8 mg/dL (ref 0.0–1.2)
CALCIUM: 9.5 mg/dL (ref 8.7–10.2)
CO2: 18 mmol/L (ref 18–29)
Chloride: 102 mmol/L (ref 96–106)
Creatinine, Ser: 0.62 mg/dL (ref 0.57–1.00)
GFR calc Af Amer: 151 mL/min/{1.73_m2} (ref >59)
GFR, EST NON AFRICAN AMERICAN: 131 mL/min/{1.73_m2} (ref >59)
GLOBULIN, TOTAL: 2.3 g/dL (ref 1.5–4.5)
Glucose: 78 mg/dL (ref 65–99)
POTASSIUM: 4.5 mmol/L (ref 3.5–5.2)
SODIUM: 142 mmol/L (ref 134–144)
Total Protein: 7.2 g/dL (ref 6.0–8.5)

## 2016-05-26 LAB — LIPASE: LIPASE: 27 U/L (ref 14–72)

## 2016-05-27 ENCOUNTER — Telehealth: Payer: Self-pay | Admitting: Family Medicine

## 2016-05-27 NOTE — Telephone Encounter (Signed)
Left vm- will attempt again  Erin SinkSam Kollins Fenter, MD [facility] 05/27/2016, 12:05 PM

## 2016-05-27 NOTE — Telephone Encounter (Signed)
Pt returning call  She has canceld US, has had improvement with mag citrate X 1  Wants to go clean out, 16 caps of miralax in 128 Oz liquid, 8 oz Q 15-20 minutes for clean out, emphasized the importance of long term miralax to help her bowel diameter return to normal.   No clear explanation for alkaline urine  Murtis SinkSam Bradshaw, MD [facility] 05/27/2016, 1:48 PM

## 2016-05-31 ENCOUNTER — Ambulatory Visit (HOSPITAL_COMMUNITY): Payer: Medicaid Other

## 2016-07-07 ENCOUNTER — Telehealth: Payer: Self-pay | Admitting: Family Medicine

## 2016-07-07 NOTE — Telephone Encounter (Signed)
lmtcb

## 2016-07-07 NOTE — Telephone Encounter (Signed)
Could consider changing formulation. We probably have breo samples which would be a reasonable choice.   Murtis SinkSam Keontae Levingston, MD [facility] 07/07/2016, 3:33 PM

## 2016-07-07 NOTE — Telephone Encounter (Signed)
Patient states that when she uses Symbicort inhaler that her throat will hurt the rest of the day. Patient states it has been going on for a month and when she does not use the inhaler her throat does not hurt. Aware you are out of the office and it will be tomorrow before we call her back. Please advise.

## 2016-07-08 NOTE — Telephone Encounter (Signed)
Dup note  

## 2016-08-07 NOTE — Telephone Encounter (Signed)
Unable to reach patient. Encounter will be closed.  

## 2016-08-17 ENCOUNTER — Ambulatory Visit (INDEPENDENT_AMBULATORY_CARE_PROVIDER_SITE_OTHER): Payer: Medicaid Other | Admitting: Physician Assistant

## 2016-08-17 ENCOUNTER — Encounter: Payer: Self-pay | Admitting: Physician Assistant

## 2016-08-17 VITALS — BP 84/60 | HR 65 | Temp 98.2°F | Ht 65.0 in | Wt 98.8 lb

## 2016-08-17 DIAGNOSIS — K589 Irritable bowel syndrome without diarrhea: Secondary | ICD-10-CM

## 2016-08-17 DIAGNOSIS — R109 Unspecified abdominal pain: Secondary | ICD-10-CM | POA: Diagnosis not present

## 2016-08-17 DIAGNOSIS — J0110 Acute frontal sinusitis, unspecified: Secondary | ICD-10-CM | POA: Diagnosis not present

## 2016-08-17 DIAGNOSIS — R634 Abnormal weight loss: Secondary | ICD-10-CM

## 2016-08-17 MED ORDER — AMOXICILLIN 500 MG PO CAPS: 1000.0000 mg | ORAL_CAPSULE | Freq: Two times a day (BID) | ORAL | 0 refills | Status: AC

## 2016-08-17 NOTE — Progress Notes (Signed)
BP (!) 84/60   Pulse 65   Temp 98.2 F (36.8 C) (Oral)   Ht _0  (1.651 m)   Wt 98 lb 12.8 oz (44.8 kg)   BMI 16.44 kg/m    Subjective:    Patient ID: Erin Shepard, female    DOB: Jul 06, 1996, 20 y.o.   MRN: [id number]  HPI: Erin Shepard is a 20 y.o. female presenting on 08/17/2016 for GI Problem (has been doing things IBSC, family member would like a test done) and Fatigue (doesn't feel like she is getting the nutrient that she should)  Patient with long-term abdominal pain and cramping. She has never been tested for gluten sensitivity but was told by her allergist to avoid it. It was causing her to have flareups of her asthma. She noted a remarkable difference when she began avoiding gluten. She also has been avoiding dairy. She continues however to have chronic abdominal pain with bloating. There is a family history of irritable bowel constipation. She is not had any other GI testing. She will sometimes have bloating and distention after she eats and does not matter what foods she eats. She denies any fever or chills. She denies any mucus or abnormalities in and around her stool  Patient is also having sinus pressure and drainage. This is gone on for several days. She was sick about 2 weeks ago with a cold. She has continued with severe pressure over her eyes and between her eyes. She denies any fever at this time.  Relevant past medical, surgical, family and social history reviewed and updated as indicated. Allergies and medications reviewed and updated.  Past Medical History:  Diagnosis Date  . Anemia   . Asthma   . Depression   . IBS (irritable bowel syndrome)     History reviewed. No pertinent surgical history.  Review of Systems  Constitutional: Positive for chills and fatigue. Negative for activity change and appetite change.  HENT: Positive for congestion, postnasal drip, sinus pain and sinus pressure. Negative for sore throat.   Eyes: Negative.     Respiratory: Negative for cough and wheezing.   Cardiovascular: Negative.  Negative for chest pain, palpitations and leg swelling.  Gastrointestinal: Positive for abdominal distention, abdominal pain and constipation.  Genitourinary: Negative.   Musculoskeletal: Negative.   Skin: Negative.   Neurological: Positive for headaches.    Allergies as of 08/17/2016      Reactions   Latex Hives   Dairy Aid [lactase]    Gluten Meal       Medication List       Accurate as of 08/17/16  5:53 PM. Always use your most recent med list.          amoxicillin 500 MG capsule Commonly known as:  AMOXIL Take 2 capsules (1,000 mg total) by mouth 2 (two) times daily.   mupirocin ointment 2 % Commonly known as:  BACTROBAN Place 1 application into the nose 2 (two) times daily. For 3 days   naproxen 500 MG tablet Commonly known as:  NAPROSYN Take 1 tablet (500 mg total) by mouth 2 (two) times daily with a meal.   PROAIR HFA 108 (90 Base) MCG/ACT inhaler Generic drug:  albuterol INHALE 2 PUFFS INTO THE LUNGS EVERY 6 (SIX) HOURS AS NEEDED FOR WHEEZING OR SHORTNESS OF BREATH.   SYMBICORT 160-4.5 MCG/ACT inhaler Generic drug:  budesonide-formoterol INHALE 2 PUFFS INTO THE LUNGS 2 (TWO) TIMES DAILY.          Objective:  BP (!) 84/60   Pulse 65   Temp 98.2 F (36.8 C) (Oral)   Ht _0  (1.651 m)   Wt 98 lb 12.8 oz (44.8 kg)   BMI 16.44 kg/m   Allergies  Allergen Reactions  . Latex Hives  . Dairy Aid [Lactase]   . Gluten Meal     Physical Exam  Constitutional: She is oriented to person, place, and time. She appears well-developed and well-nourished.  HENT:  Head: Normocephalic and atraumatic.  Right Ear: Tympanic membrane and external ear normal. No middle ear effusion.  Left Ear: Tympanic membrane and external ear normal.  No middle ear effusion.  Nose: Mucosal edema and rhinorrhea present. Right sinus exhibits no maxillary sinus tenderness. Left sinus exhibits no maxillary  sinus tenderness.  Mouth/Throat: Uvula is midline. Posterior oropharyngeal erythema present.  Eyes: Conjunctivae and EOM are normal. Pupils are equal, round, and reactive to light. Right eye exhibits no discharge. Left eye exhibits no discharge.  Neck: Normal range of motion.  Cardiovascular: Normal rate, regular rhythm and normal heart sounds.   Pulmonary/Chest: Effort normal and breath sounds normal. No respiratory distress. She has no wheezes.  Abdominal: Soft.  Lymphadenopathy:    She has no cervical adenopathy.  Neurological: She is alert and oriented to person, place, and time.  Skin: Skin is warm and dry.  Psychiatric: She has a normal mood and affect.        Assessment & Plan:   1. Stomach pain - CBC with Differential/Platelet - CMP14+EGFR - TSH - H Pylori, IGM, IGG, IGA AB Due to the chronicity of this problem, I advised the patient she should see a gastroenterologist. She will be moving to [place] this coming weekend. She will let us know a provider once she has established there.  2. Loss of weight - CBC with Differential/Platelet - CMP14+EGFR - TSH - H Pylori, IGM, IGG, IGA AB  3. Irritable bowel syndrome, unspecified type - CBC with Differential/Platelet - CMP14+EGFR - TSH - H Pylori, IGM, IGG, IGA AB  4. Acute non-recurrent frontal sinusitis Amoxicillin 500 mg 2 tablets by mouth twice a day for 10 days #40   Continue all other maintenance medications as listed above.  Follow up plan: Return if symptoms worsen or fail to improve.  Orders Placed This Encounter  Procedures  . CBC with Differential/Platelet  . CMP14+EGFR  . TSH  . H Pylori, IGM, IGG, IGA AB    Educational handout given for H pylori testing  Terald Sleeper PA-C [facility] [address]  [place], [place] [address] [phone]   08/17/2016, 5:53 PM  Plan GI referral when est in [place]

## 2016-08-17 NOTE — Patient Instructions (Signed)
Helicobacter Pylori Antibodies Test Why am I having this test? This is a blood test that looks for bacteria called Helicobacter pylori (H. pylori). H. pylori is a germ that can be found in the cells that line the stomach. Having high levels of H. pylori in your stomach puts you at risk for stomach ulcers and small bowel ulcers, long-term (chronic) inflammation of the lining of the stomach, or even ulcers that may occur in the canal that runs from the mouth to the stomach (esophagus). Presence of H. pylori can also increase your risk for stomach cancer if it is left untreated. Most people with H. pylori in their stomach bacteria have no symptoms. Your health care provider may ask you to have this test if you have symptoms of a stomach ulcer or small bowel ulcer, such as stomach pain before or after eating, heartburn, or nausea repeatedly after eating. What kind of sample is taken? A blood sample is required for this test. It is usually collected by inserting a needle into a vein or sticking a finger with a small needle. How do I prepare for this test? There is no preparation required for this test. What are the reference ranges? Reference ranges are considered healthy ranges established after testing a large group of healthy people. Reference ranges may vary among different people, labs, and hospitals. It is your responsibility to obtain your test results. Ask the lab or department performing the test when and how you will get your results. Your test results will be reported as positive, negative, or equivocal. Equivocal means that your results are neither positive nor negative. References ranges for these results are as follows:  Less than or equal to 30 units/mL. This is negative.  Greater than or equal to 40 units/mL. This is positive.  30.01-39.99 units/mL. This is equivocal. What do the results mean? Test results that are higher than normal may indicate numerous health conditions. These may  include:  Short-term or long-term irritation of the stomach lining (gastritis).  Small bowel ulcer.  Stomach ulcer.  Stomach cancer. Talk with your health care provider to discuss your results, treatment options, and if necessary, the need for more tests. Talk with your health care provider if you have any questions about your results. Talk with your health care provider to discuss your results, treatment options, and if necessary, the need for more tests. Talk with your health care provider if you have any questions about your results. This information is not intended to replace advice given to you by your health care provider. Make sure you discuss any questions you have with your health care provider. Document Released: 09/02/2004 Document Revised: 04/14/2016 Document Reviewed: 12/21/2013 Elsevier Interactive Patient Education  2017 [employer]

## 2016-08-18 LAB — CMP14+EGFR
A/G RATIO: 2.3 — AB (ref 1.2–2.2)
ALBUMIN: 4.5 g/dL (ref 3.5–5.5)
ALK PHOS: 71 IU/L (ref 39–117)
ALT: 26 IU/L (ref 0–32)
AST: 25 IU/L (ref 0–40)
BUN / CREAT RATIO: 19 (ref 9–23)
BUN: 9 mg/dL (ref 6–20)
Bilirubin Total: 0.5 mg/dL (ref 0.0–1.2)
CO2: 26 mmol/L (ref 18–29)
CREATININE: 0.48 mg/dL — AB (ref 0.57–1.00)
Calcium: 9.1 mg/dL (ref 8.7–10.2)
Chloride: 97 mmol/L (ref 96–106)
GFR calc Af Amer: 163 mL/min/{1.73_m2} (ref >59)
GFR calc non Af Amer: 142 mL/min/{1.73_m2} (ref >59)
GLOBULIN, TOTAL: 2 g/dL (ref 1.5–4.5)
Glucose: 70 mg/dL (ref 65–99)
POTASSIUM: 3.8 mmol/L (ref 3.5–5.2)
SODIUM: 139 mmol/L (ref 134–144)
Total Protein: 6.5 g/dL (ref 6.0–8.5)

## 2016-08-18 LAB — H PYLORI, IGM, IGG, IGA AB
H pylori, IgM Abs: <9 units (ref 0.0–8.9)
H. pylori, IgA Abs: <9 units (ref 0.0–8.9)

## 2016-08-18 LAB — CBC WITH DIFFERENTIAL/PLATELET
BASOS: 1 %
Basophils Absolute: 0 10*3/uL (ref 0.0–0.2)
EOS (ABSOLUTE): 0.2 10*3/uL (ref 0.0–0.4)
EOS: 4 %
HEMATOCRIT: 37.8 % (ref 34.0–46.6)
HEMOGLOBIN: 12.8 g/dL (ref 11.1–15.9)
Immature Grans (Abs): 0 10*3/uL (ref 0.0–0.1)
Immature Granulocytes: 0 %
LYMPHS ABS: 1.3 10*3/uL (ref 0.7–3.1)
Lymphs: 31 %
MCH: 28.8 pg (ref 26.6–33.0)
MCHC: 33.9 g/dL (ref 31.5–35.7)
MCV: 85 fL (ref 79–97)
MONOCYTES: 9 %
MONOS ABS: 0.4 10*3/uL (ref 0.1–0.9)
NEUTROS ABS: 2.4 10*3/uL (ref 1.4–7.0)
Neutrophils: 55 %
Platelets: 164 10*3/uL (ref 150–379)
RBC: 4.45 x10E6/uL (ref 3.77–5.28)
RDW: 13.3 % (ref 12.3–15.4)
WBC: 4.3 10*3/uL (ref 3.4–10.8)

## 2016-08-18 LAB — TSH: TSH: 1.01 u[IU]/mL (ref 0.450–4.500)

## 2016-08-19 ENCOUNTER — Other Ambulatory Visit: Payer: Self-pay | Admitting: Family Medicine

## 2016-09-14 ENCOUNTER — Other Ambulatory Visit: Payer: Self-pay | Admitting: Family Medicine

## 2016-10-18 ENCOUNTER — Telehealth: Payer: Self-pay

## 2016-10-19 NOTE — Telephone Encounter (Signed)
Take her information, she was to have a gastroenterology referral once established.

## 2016-10-20 NOTE — Telephone Encounter (Signed)
Left message for patient to call with details of gastrologist location, name, etc.

## 2016-10-29 ENCOUNTER — Encounter: Payer: Self-pay | Admitting: *Deleted

## 2016-10-29 NOTE — Telephone Encounter (Signed)
Letter mailed asking for details or decline on referral.

## 2016-11-22 ENCOUNTER — Other Ambulatory Visit: Payer: Self-pay

## 2016-11-22 DIAGNOSIS — Z124 Encounter for screening for malignant neoplasm of cervix: Secondary | ICD-10-CM

## 2017-02-20 ENCOUNTER — Other Ambulatory Visit: Payer: Self-pay | Admitting: Family Medicine

## 2017-03-21 ENCOUNTER — Other Ambulatory Visit: Payer: Self-pay | Admitting: Family Medicine

## 2017-04-05 ENCOUNTER — Other Ambulatory Visit: Payer: Self-pay | Admitting: Family Medicine

## 2017-04-06 NOTE — Telephone Encounter (Signed)
Last seen 08/17/17  Lawanna KobusAngel  Dr Darlyn ReadStacks PCP

## 2017-06-23 ENCOUNTER — Other Ambulatory Visit: Payer: Self-pay | Admitting: *Deleted

## 2017-06-23 ENCOUNTER — Telehealth: Payer: Self-pay

## 2017-06-23 DIAGNOSIS — J302 Other seasonal allergic rhinitis: Secondary | ICD-10-CM

## 2017-06-23 NOTE — Telephone Encounter (Signed)
Please refer as requested 

## 2017-06-23 NOTE — Telephone Encounter (Signed)
Wants a referral to [facility] [place]

## 2017-08-17 ENCOUNTER — Other Ambulatory Visit: Payer: Self-pay

## 2017-08-17 DIAGNOSIS — J45909 Unspecified asthma, uncomplicated: Secondary | ICD-10-CM

## 2017-08-17 MED ORDER — ALBUTEROL SULFATE HFA 108 (90 BASE) MCG/ACT IN AERS: 2.0000 | INHALATION_SPRAY | Freq: Four times a day (QID) | RESPIRATORY_TRACT | 0 refills | Status: AC | PRN

## 2017-08-17 NOTE — Telephone Encounter (Signed)
Last seen 08/17/17  Angel 

## 2017-08-18 ENCOUNTER — Telehealth: Payer: Self-pay

## 2017-08-18 NOTE — Telephone Encounter (Signed)
Medicaid denied prior auth for Symbicort

## 2017-08-24 ENCOUNTER — Other Ambulatory Visit: Payer: Self-pay | Admitting: Family Medicine

## 2017-08-24 NOTE — Telephone Encounter (Signed)
It is listed as preferred on the [place] formulary. PLease appeal Thanks, WS

## 2017-08-24 NOTE — Telephone Encounter (Signed)
Erin Shepard aware 

## 2017-08-25 NOTE — Progress Notes (Unsigned)
Symbicort not approved because patient has family planning Medicaid

## 2017-08-25 NOTE — Progress Notes (Signed)
Please contact the patient to let her know that no inhalers are covered by medicaid if it is the family planning version. WS

## 2017-08-26 NOTE — Progress Notes (Signed)
Left message, family care does not cover any inhalers..Marland Kitchen

## 2018-03-02 IMAGING — DX DG ABDOMEN 2V
2 series · 2 of 2 positions shown · non-contrast
Comparison: None.

CLINICAL DATA: Increasing abdominal pain.

EXAM:
ABDOMEN - 2 VIEW

[abdomen erect]
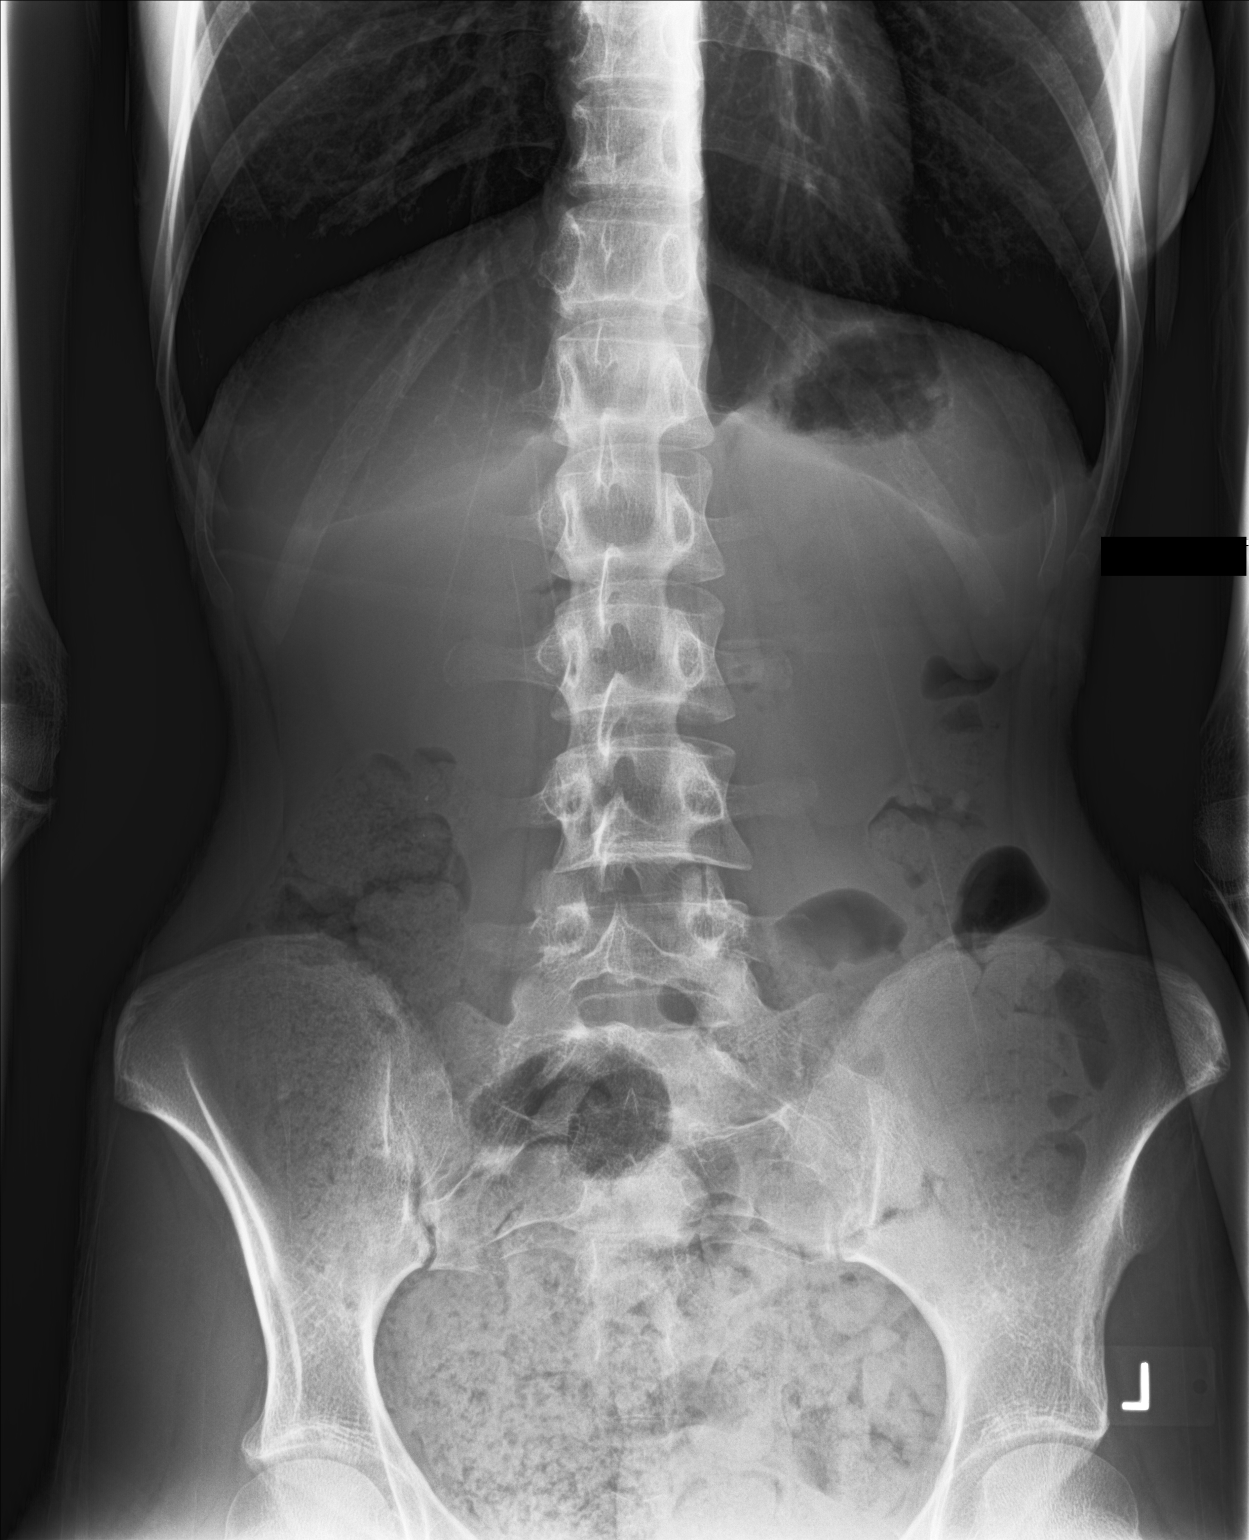

[abdomen supine]
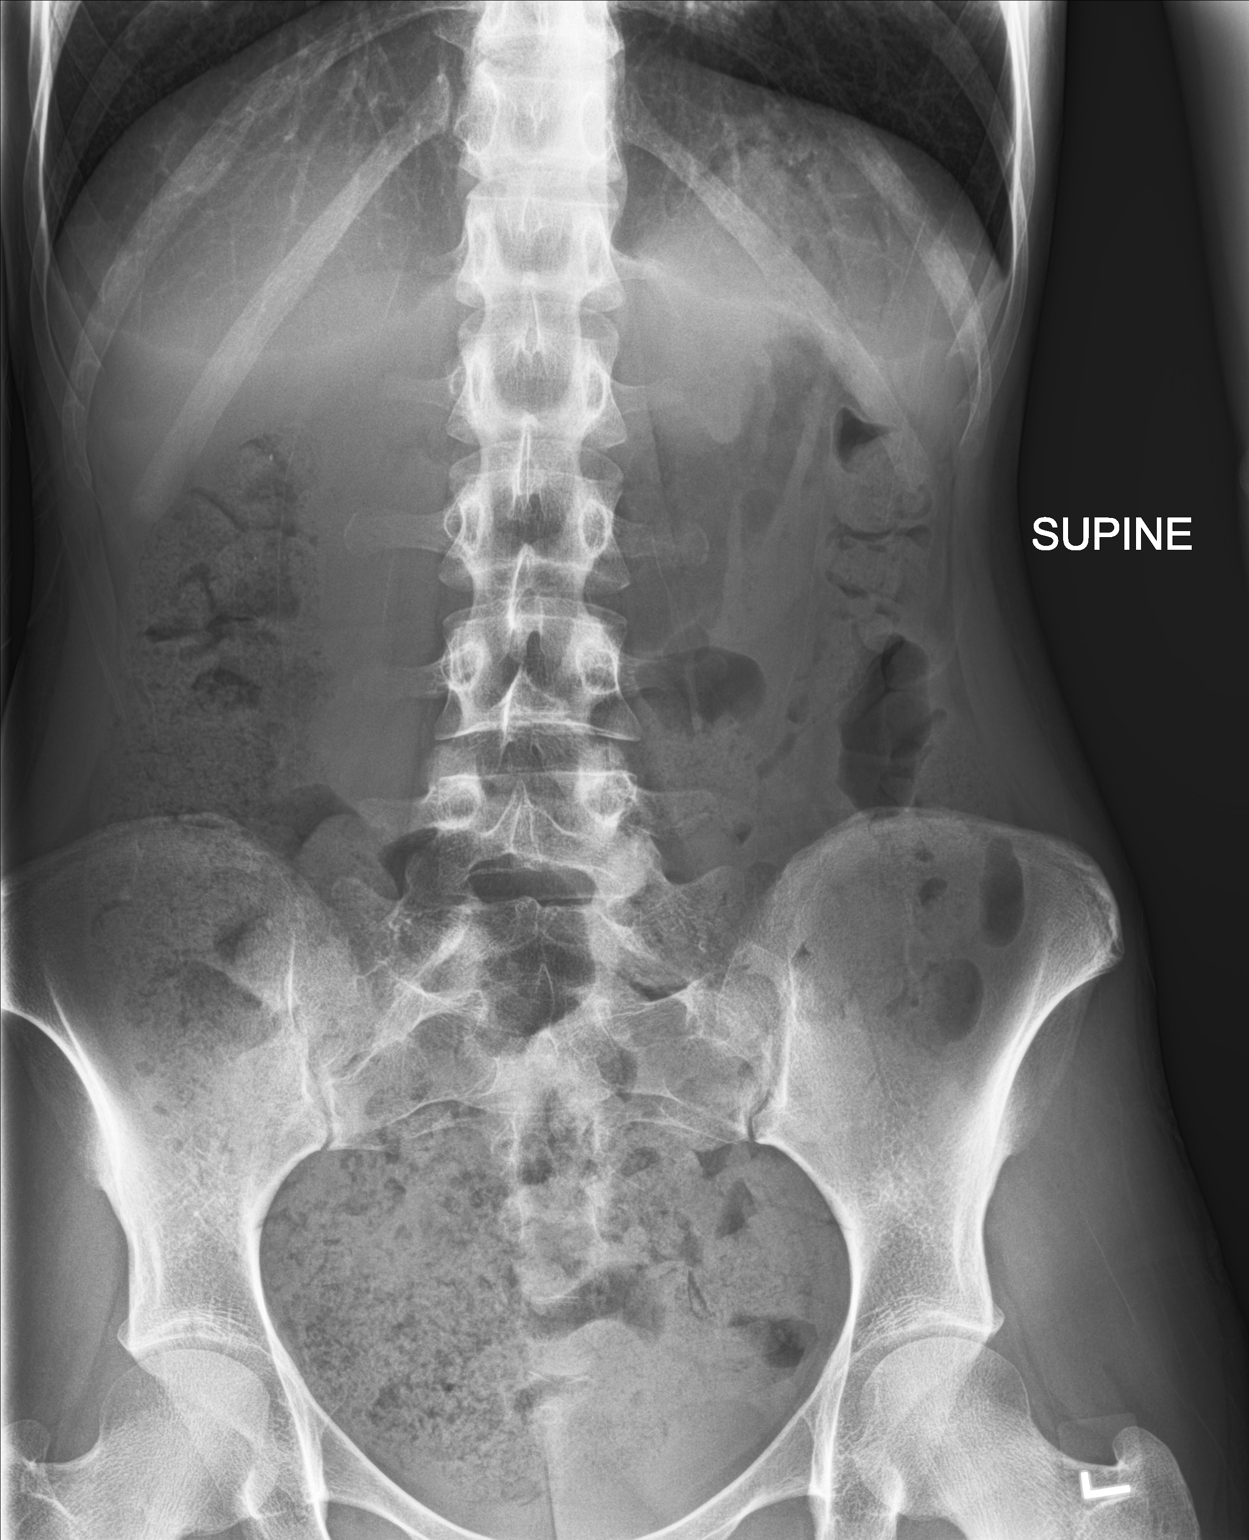

[2 of 2 positions shown; findings below may reference images not displayed]

FINDINGS: The bowel gas pattern is nonobstructive. There is a large amount of
formed stool throughout the colon consistent with constipation. The
cecum overlies the rectum and therefore no reliable measurement of
the size of the rectum could be achieved. Rectal impaction cannot be
excluded. There is no evidence of free air. No radio-opaque calculi
or other significant radiographic abnormality is seen.
IMPRESSION: Constipation.  Rectal impaction cannot be excluded.
